# Patient Record
Sex: Male | Born: 1945 | Race: White | Hispanic: No
Health system: Southern US, Community
[De-identification: ages and names within clinical notes are randomized; demographics above are authoritative.]

---

## 2011-12-14 ENCOUNTER — Inpatient Hospital Stay: Payer: Self-pay | Admitting: Internal Medicine

## 2011-12-14 LAB — CK TOTAL AND CKMB (NOT AT ARMC)
CK, Total: 127 U/L (ref 35–232)
CK-MB: 2.4 ng/mL (ref 0.5–3.6)

## 2011-12-14 LAB — CBC
HGB: 9.5 g/dL — ABNORMAL LOW (ref 13.0–18.0)
MCH: 34.4 pg — ABNORMAL HIGH (ref 26.0–34.0)
MCHC: 33.1 g/dL (ref 32.0–36.0)
MCV: 104 fL — ABNORMAL HIGH (ref 80–100)
RBC: 2.76 10*6/uL — ABNORMAL LOW (ref 4.40–5.90)
RDW: 14.3 % (ref 11.5–14.5)
WBC: 7.5 10*3/uL (ref 3.8–10.6)

## 2011-12-14 LAB — APTT: Activated PTT: 26.8 secs (ref 23.6–35.9)

## 2011-12-14 LAB — COMPREHENSIVE METABOLIC PANEL
Albumin: 3.2 g/dL — ABNORMAL LOW (ref 3.4–5.0)
Alkaline Phosphatase: 121 U/L (ref 50–136)
Creatinine: 1.44 mg/dL — ABNORMAL HIGH (ref 0.60–1.30)
EGFR (Non-African Amer.): 51 — ABNORMAL LOW
Glucose: 99 mg/dL (ref 65–99)
Osmolality: 282 (ref 275–301)
Potassium: 3.7 mmol/L (ref 3.5–5.1)
Sodium: 142 mmol/L (ref 136–145)

## 2011-12-14 LAB — TROPONIN I: Troponin-I: <0.02 ng/mL

## 2011-12-14 LAB — PROTIME-INR: INR: 0.9

## 2011-12-15 LAB — CK TOTAL AND CKMB (NOT AT ARMC)
CK, Total: 111 U/L (ref 35–232)
CK, Total: 103 U/L (ref 35–232)
CK-MB: 2 ng/mL (ref 0.5–3.6)

## 2011-12-15 LAB — CBC WITH DIFFERENTIAL/PLATELET
Basophil #: 0.1 10*3/uL (ref 0.0–0.1)
Basophil %: 0.9 %
Eosinophil #: 0.3 10*3/uL (ref 0.0–0.7)
Eosinophil %: 4.4 %
HCT: 26.1 % — ABNORMAL LOW (ref 40.0–52.0)
HGB: 8.8 g/dL — ABNORMAL LOW (ref 13.0–18.0)
Lymphocyte #: 2.5 10*3/uL (ref 1.0–3.6)
Lymphocyte %: 35.1 %
MCV: 104 fL — ABNORMAL HIGH (ref 80–100)
Monocyte #: 0.7 x10 3/mm (ref 0.2–1.0)
Neutrophil #: 3.5 10*3/uL (ref 1.4–6.5)
Platelet: 426 10*3/uL (ref 150–440)
RBC: 2.51 10*6/uL — ABNORMAL LOW (ref 4.40–5.90)
RDW: 14.5 % (ref 11.5–14.5)
WBC: 7 10*3/uL (ref 3.8–10.6)

## 2011-12-15 LAB — BASIC METABOLIC PANEL
Anion Gap: 9 (ref 7–16)
BUN: 10 mg/dL (ref 7–18)
Calcium, Total: 8.6 mg/dL (ref 8.5–10.1)
Co2: 26 mmol/L (ref 21–32)
EGFR (African American): >60
EGFR (Non-African Amer.): 54 — ABNORMAL LOW
Sodium: 144 mmol/L (ref 136–145)

## 2011-12-15 LAB — LIPID PANEL
Cholesterol: 114 mg/dL (ref 0–200)
Ldl Cholesterol, Calc: 42 mg/dL (ref 0–100)
VLDL Cholesterol, Calc: 15 mg/dL (ref 5–40)

## 2011-12-15 LAB — TROPONIN I: Troponin-I: <0.02 ng/mL

## 2012-11-07 ENCOUNTER — Ambulatory Visit: Payer: Self-pay | Admitting: Family Medicine

## 2012-11-07 LAB — URINALYSIS, COMPLETE
Bacteria: NEGATIVE
Nitrite: NEGATIVE
Squamous Epithelial: NONE SEEN

## 2012-11-22 ENCOUNTER — Ambulatory Visit: Payer: Self-pay | Admitting: Urology

## 2014-11-09 NOTE — Consult Note (Signed)
PATIENT NAME:  Michael Luna, Echo MR#:  [id number] DATE OF BIRTH:  01/19/1946  DATE OF CONSULTATION:  12/14/2011  REFERRING PHYSICIAN:   CONSULTING PHYSICIAN:  Laurier NancyShaukat A. Zyaire Dumas, MD  INDICATION FOR CONSULTATION: Chest pain.   HISTORY OF PRESENT ILLNESS: This is a 69 year old white male with a past medical history of hypertension and hypercholesterolemia who came in with chest pain. The chest pain was pressure-type associated with shortness of breath, diaphoresis, and nausea. He says two days ago he started having pressure-type chest pain, 5/10, associated with nausea and diaphoresis. He sat down and got better. Last night he had another episode of chest pain and then this morning he was walking and had another episode of chest pain, thus he came into the Emergency Room. In the Emergency Room he had 8/10 chest pain. He received some morphine and nitroglycerin with relief of the chest pain. He is not having chest pain right now but is having some tightness.   PAST MEDICAL HISTORY:  1. History of bipolar disorder. 2. Hypertension. 3. Hypercholesterolemia. 4. History of hypothyroidism.   SOCIAL HISTORY: Quit smoking seven years ago. Occasional drinking, social drinking history.   REVIEW OF SYSTEMS: He has history of TIA in the past.   PHYSICAL EXAMINATION:   GENERAL: He is alert and oriented x3.   VITAL SIGNS: Blood pressure 130/70, pulse 70, respirations 14. He is afebrile.   NECK: No JVD.   LUNGS: Clear.   HEART: Regular rate and rhythm. Normal S1, S2. No audible murmur.   ABDOMEN: Soft, nontender. Positive bowel sounds.   EXTREMITIES: No pedal edema.   NEUROLOGIC: The patient appears to be intact.   EKG shows normal sinus rhythm, 97 beats per minute, left axis deviation, nonspecific ST-T changes.   ASSESSMENT AND PLAN: The patient has unstable angina with multiple risk factors for coronary artery disease. He is still having some tightness in the chest. There are no acute EKG changes,  however, given his presentation plan is to do left heart catheterization to further evaluate. The patient has agreed to do the procedure.  ____________________________ Laurier NancyShaukat A. Leonardo Makris, MD sak:drc D: 12/14/2011 20:06:10 ET T: 12/15/2011 05:48:46 ET JOB#: [id number]  cc: Laurier NancyShaukat A. Rosabel Sermeno, MD, <Dictator> Laurier NancySHAUKAT A Efraim Vanallen MD ELECTRONICALLY SIGNED 12/26/2011 15:46

## 2014-11-09 NOTE — Discharge Summary (Signed)
PATIENT NAME:  Michael Luna, Michael Luna MR#:  [id number] DATE OF BIRTH:  February 19, 1946  DATE OF ADMISSION:  12/14/2011 DATE OF DISCHARGE:  12/15/2011  DISCHARGE DIAGNOSES:  1. Chest pain, not cardiac in nature, possibly related to GI/reflux.  2. Hypertension.  3. Hypothyroidism.  4. Anemia of chronic disease.  5. History of bipolar disorder.   CONSULT: Dr. Welton FlakesKhan    PROCEDURE: The patient underwent a cardiac catheterization on 12/15/2011 which showed normal ejection fraction and normal coronary arteries.   LABORATORY, DIAGNOSTIC, AND RADIOLOGICAL DATA: Troponin x3 were negative. Discharge white blood cells 7, hemoglobin 8.8, hematocrit 26.1, platelets 426, sodium 144, potassium 3.9, chloride 109, bicarb 26, BUN 10, creatinine 1.38, glucose 116, cholesterol 114, triglycerides 77, HDL 57, VLDL 15, LDL 42.   CT of the chest for PE was negative for pulmonary emboli.   HOSPITAL COURSE: The patient is a 69 year old male who presented with chest pain. For further details, please refer to the history and physical.  1. Chest pain. The patient was admitted to telemetry. Cardiac enzymes were cycled, all of which were negative. There were no acute events on telemetry. He underwent cardiac catheterization by Dr. Adrian BlackwaterShaukat Khan which essentially was normal. Chest pain could be related to GI issues with reflux or hiatal hernia. It was recommended that he continue on a PPI.  2. Bipolar disease. The patient will continue current medication. 3. Hypertension, well controlled on his outpatient medication regimen.  4. Hypothyroidism. He is on Synthroid.  5. Anemia of chronic disease. His hemoglobin remained stable.    DISCHARGE MEDICATIONS:  1. Lasix 1 tablet daily.  2. Ciprofloxacin 500 b.i.d. which was given to him for urinary tract infection.  3. Docusate 100 mg b.i.d.  4. Oxycodone 5 mg 1 to 2 tablets q.4 to 6 hours p.r.n. pain.  5. Tylenol 500 mg 2 tablets p.r.n.  6. Calcium 2 tablets daily. 7. Fish Oil 1000 mg  t.i.d.  8. Cyclobenzaprine 10 mg b.i.d. p.r.n.  9. Methylphenidate 10 mg 3 tablets b.i.d.  10. Topiramate 50 mg 2 tablets daily.  11. Synthroid 50 mcg daily.  12. Atorvastatin 40 mg daily.  13. Omeprazole 40 mg b.i.d.  14. Finasteride 5 mg daily.  15. Bupropion 150 mg 3 tablets daily.  16. Quetiapine 200 mg half a tablet in the morning, 3 tablets in the evening.  17. Norvasc 5 mg daily.  18. Tizanidine 2 mg q.6 hours p.r.n. spasm.  19. Tamsulosin 0.4 mg daily.   DISCHARGE DIET: Low sodium diet.   DISCHARGE ACTIVITY: As tolerated. No exertion or heavy lifting for one week.   TIME SPENT: 30 minutes.   ____________________________ Janyth ContesSital P. Juliene PinaMody, MD spm:drc D: 12/15/2011 13:30:13 ET T: 12/16/2011 09:33:34 ET JOB#: [id number]  cc: Liem Copenhaver P. Juliene PinaMody, MD, <Dictator>  Janyth ContesSITAL P Iszabella Hebenstreit MD ELECTRONICALLY SIGNED 12/23/2011 12:40

## 2014-11-09 NOTE — H&P (Signed)
PATIENT NAME:  Michael Luna, Michael Luna MR#:  [id number] DATE OF BIRTH:  Nov 19, 1945  DATE OF ADMISSION:  12/14/2011  PRIMARY CARE PHYSICIAN: Dr. Delton PrairiePaul Tobin, [place], [place].   CHIEF COMPLAINT: Chest pressure.  HISTORY OF PRESENT ILLNESS: The patient is a 69 year old male who has history of transient ischemic attack. He has history of bipolar disorder, anxiety, gastroesophageal reflux disease, hiatal hernia, Barrett's esophagus, hypertension, and hypothyroidism. Today she is sent from the primary care physician's office because of chest pressure. He says his chest pressure started last night. Last night he went for a small walk and he started having significant chest pressure on the front of the chest. He could not take a deep breath with the chest pressure, then he rested and the chest pressure resolved. He could sleep overnight, but then he started having chest pressure again this morning when he went for a walk. He went to the driveway and to the mailbox and then he started having this chest pressure again associated with shortness of breath, but he denies any diaphoresis or nausea. He rested and then the chest pressure eased off. Again later in the afternoon, he started having chest pressure. He also started having upper back pain with some shortness of breath. He went to see his primary care physician's office. He was given one dose of sublingual nitroglycerin that did not help the chest pain and he was transferred to the Emergency Room. He was given four pills of 81 mg of aspirin at Dr. Verdene Lennertobin's office. His chest pressure has improved right now. The upper back pain has improved. He says he has been told that he had angina in the past. He used to see a cardiologist in [place]. He had a stress test about a month ago prior to the back surgery for preoperative evaluation and that was negative. He never had a cardiac catheterization in the past so he is being admitted for unstable angina. He got a dose of Lovenox in the  Emergency Room. Cardiology has been consulted by ER physician and plan for catheterization in the morning. He says that he has hiatal hernia and he has heartburn, but this chest pressure is different from heartburn. In the Emergency Room says he had some anxiety spells when they pushed 2 milligrams of IV morphine.   REVIEW OF SYSTEMS: He denies any fever or weakness. No acute change in vision. No headache. No dizziness. He says he has a dry cough since back surgery. No painful respiration. He presented with chest pressure. No palpitations or syncope. No nausea, vomiting, diarrhea, abdominal pain, gastrointestinal bleed. He said that he had some prostate infection recently, some dysuria. He is on antibiotic for that. He also has some difficulty urinating. He has thyroid problems. No anemia. No rash. No joint pains. His back pain is better after the back surgery. No focal numbness or weakness. He has anxiety.   PAST MEDICAL HISTORY:  1. Hypertension.  2. Hyperlipidemia.  3. Hypothyroidism. 4. Bipolar disorder. 5. Hiatal hernia with history of Barrett's esophagus and gastroesophageal reflux disease. 6. History of transient ischemic attack. 7. He had recent back surgery about three weeks ago, laminectomy and lumbar fusion and a [facility].   ALLERGIES TO MEDICATIONS: He is allergic to Romazicon, Lithium, Darvon, Vibramycin, erythromycin and tetracycline.   HOME MEDICATIONS: (This is what the patient told me). 1. Seroquel 100 mg in the morning, 600 mg at bedtime. 2. Topamax  100 mg daily.  3. Wellbutrin 450 mg daily.  4. Ritalin 30 mg in the morning and 30 mg at noon. 5. Prilosec 20 mg b.i.d.  6. Synthroid 50 mcg daily.  7. Lipitor 40 mg at bedtime.  8. Amlodipine 5 mg daily.  9. Ciprofloxacin 250 mg b.i.d. for two weeks, recently started on furosemide.  10. Finasteride 5 mg daily. 11. Cyclobenzaprine 10 mg twice a day  p.r.n.  12. Zanaflex 2 mg every six hours p.r.n. for spasm.  13. He is not taking Flomax anymore.  14. Acetaminophen p.r.n.  15. Fish oil 1000 mg t.i.d.  16. Calcium with vitamin D, two tablets in the morning, one at noon and two at bedtime.   SOCIAL HISTORY: He recently moved to [facility]. He quit smoking seven years ago. He smoked for about 40 years, a pack per day. Occasional alcohol drinker. He denies any drug use.   FAMILY HISTORY: Significant for stroke. His dad had stroke. Grandmother had stroke. Mother had stroke, but no history of early coronary artery disease in the family.   PHYSICAL EXAMINATION:  VITAL SIGNS: In the Emergency Room, temperature 99.2, heart rate 103, respiratory 18, blood pressure 117/66, saturating 95% on room air.   GENERAL: He is an elderly Caucasian male, well built. His chest pain has improved right now, not in acute distress.   HEENT: Bilateral pupils are equal. Extraocular muscles intact. No scleral icterus. No conjunctivitis. Oral mucosa is moist. No pallor.   NECK: No thyroid tenderness, enlargement, or nodules. Neck is supple. No masses, nontender. No adenopathy. No JVD. No carotid bruit.   CHEST: Bilateral breath sounds are clear. No wheeze. Normal effort. No respiratory distress.   HEART: Heart sounds are regular. No murmur. Good peripheral pulses. No lower extremity edema.   ABDOMEN: Soft, nontender. Normal bowel sounds. No hepatomegaly. No bruit. No masses.   RECTAL: Deferred.   NEUROLOGIC: He is awake, alert, oriented to time, place, and person. Cranial nerves are intact. Moving all extremities against gravity.   EXTREMITIES: No cyanosis. No clubbing.   SKIN: He has a scar in the lower back area which appears to be healing well.   LABORATORY, RADIOLOGICAL AND DIAGNOSTIC DATA: White count 7.5, hemoglobin 9.5, platelet count 481,000. BMP: Sodium 142, potassium 3.7, BUN 10, creatinine 1.44. CK 127. Troponin is negative. He had a CT of the chest  for PE with contrast but did not show any evidence of PE. The lungs are clear. His EKG shows sinus rhythm but no acute ischemia. He has left axis deviation.   IMPRESSION:  1. Chest pain with typical features.  2. Unstable angina. Rule out myocardial infarction. Rule out coronary artery disease.  3. Hypertension. 4. Hyperlipidemia. 5. Hypothyroidism. 6. Bipolar disorder. 7. Anxiety.  8. He has anemia, most likely postoperative with recent back surgery.  9. Renal insufficiency. 10. History of transient ischemic attack. 11. Gastroesophageal reflux disease.   PLAN: A 69 year old male who was a previous smoker with history of hypertension, hyperlipidemia, hypothyroidism, history of transient ischemic attack, back surgery about three weeks ago. He presented with typical chest pains with exertion, improved at rest, did not improve with sublingual nitroglycerin. He had a stress test at [facility] a month ago that was negative. He already received four baby aspirins in Dr. Verdene Lennert office. I am going to start him on metoprolol. He is already on Lipitor 40 mg at bedtime. I am going to continue. He started on Lovenox, will continue. Will start nitroglycerin on him.  He says he has anxiety spells with morphine which I am not going to order. We will check a lipid profile on him. Cardiology has already been consulted. Plan is for a cardiac catheterization in the morning. I will give him some IV hydration with half normal saline because of his renal insufficiency. We will restart all his psychotropic medications. Plan was discussed with the patient and the family members, also discussed with Dr. Adrian Blackwater.   TIME SPENT WITH ADMISSION AND COORDINATION OF CARE: 55 minutes.    ____________________________ Fredia Sorrow, MD ag:ap D: 12/14/2011 20:06:11 ET T: 12/15/2011 07:47:53 ET JOB#: [id number]  cc: Fredia Sorrow, MD, <Dictator> Vic Ripper. Mariana Kaufman, MD Fredia Sorrow MD ELECTRONICALLY SIGNED 01/02/2012 11:44

## 2014-11-09 NOTE — Consult Note (Signed)
Patient has normal coronarie with normal LVEF. Advise discharging with protonix 40 bid with f/u office next monday 2 pm.  Electronic Signatures: Radene KneeKhan, Brnadon Eoff Ali (MD)  (Signed on 30-May-13 09:55)  Authored  Last Updated: 30-May-13 09:55 by Radene KneeKhan, Jaunice Mirza Ali (MD)

## 2014-12-10 IMAGING — CT CT ABD-PELV W/O CM
1 of 3 series · 14 of 32 positions shown, 19 images · non-contrast
Comparison: None

REASON FOR EXAM: CALL REPORT [id number] RLQ PAIN Right Flank Pain
Hematuria Eval for stone
COMMENTS:

PROCEDURE:     CT  - CT ABDOMEN AND PELVIS W[DATE]  [DATE]
RESULT:      Indication: Flank Pain
TECHNIQUE: Multiple axial images from the lung bases to the symphysis pubis
were obtained without oral and without intravenous contrast.

[Series 3: [place] · axial · 0.72mm/px · z∈[-542,-117]mm · 14 of 394 slices shown, 19 images]
[im 20/394  soft-tissue]
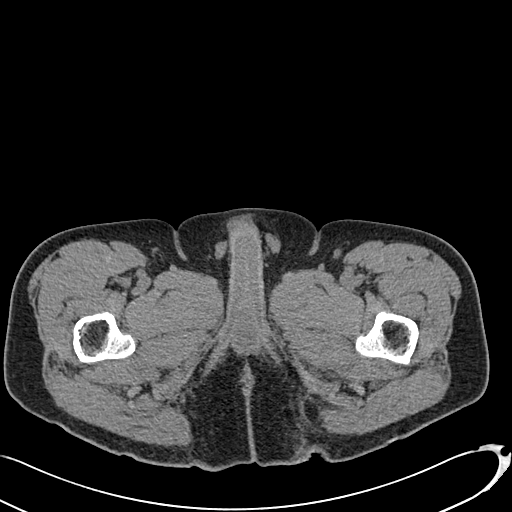
[im 20/394  bone]
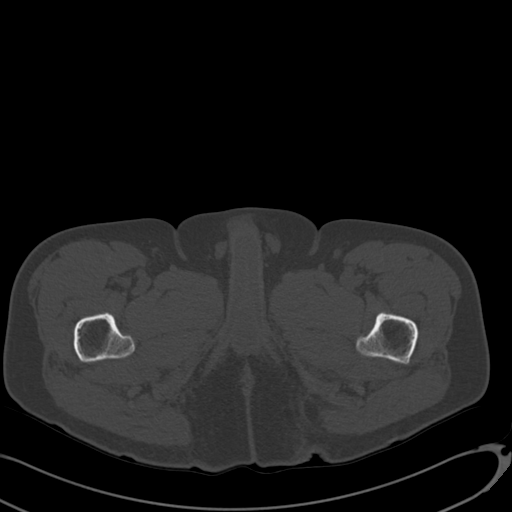
[im 59/394  soft-tissue]
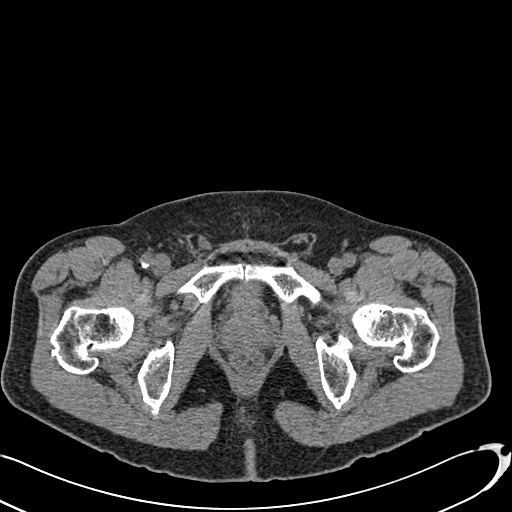
[im 79/394  soft-tissue]
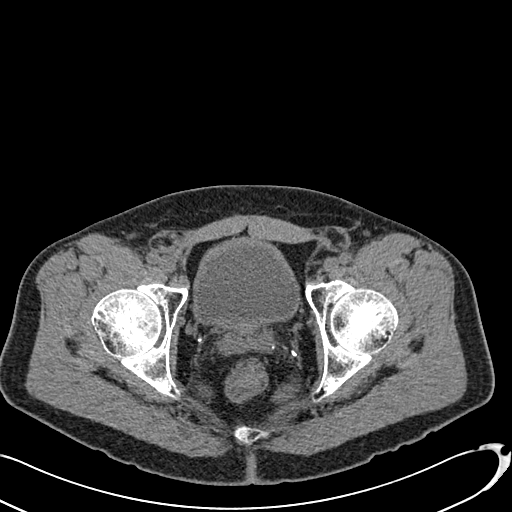
[im 118/394  soft-tissue]
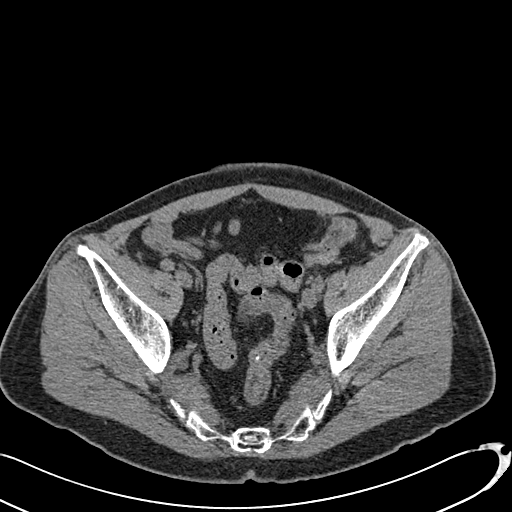
[im 138/394  soft-tissue]
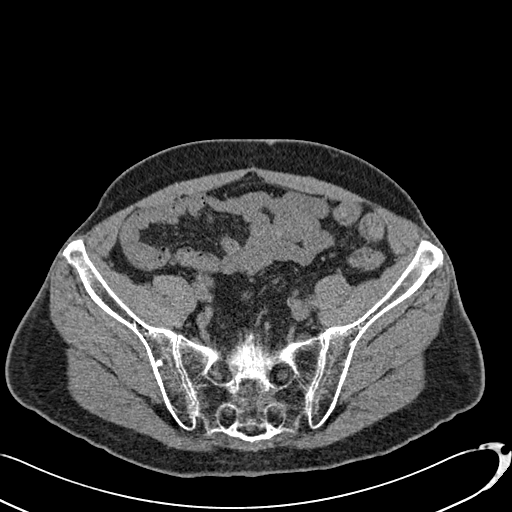
[im 177/394  soft-tissue]
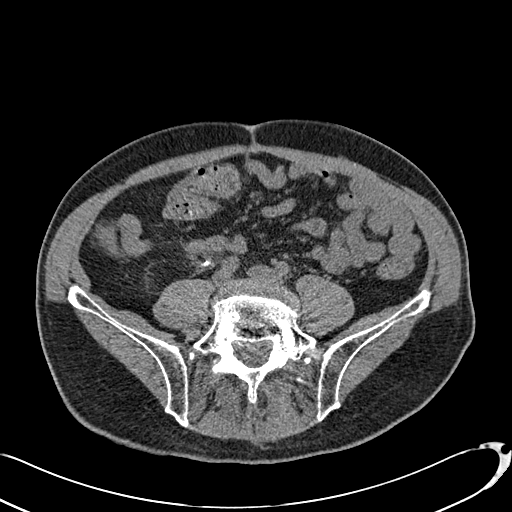
[im 197/394  soft-tissue]
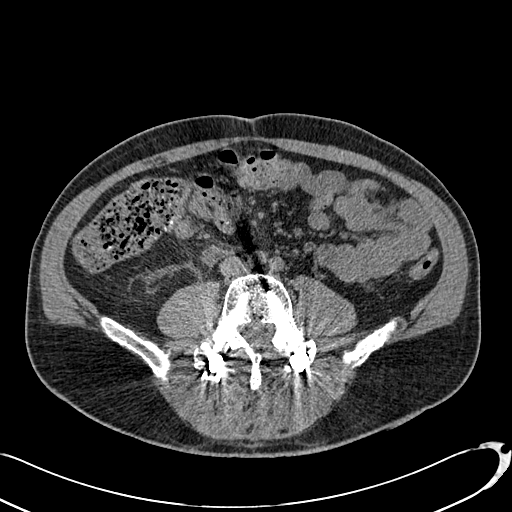
[im 217/394  soft-tissue]
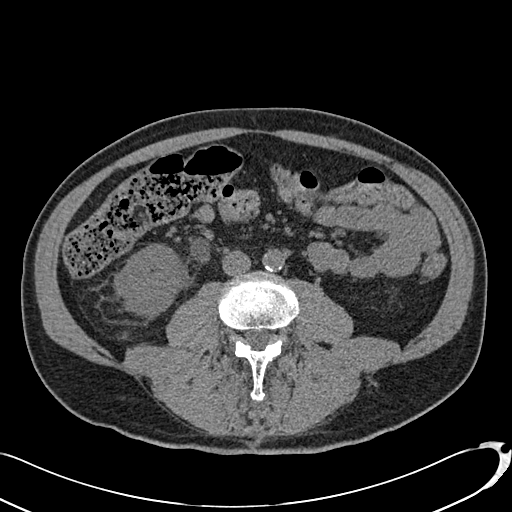
[im 256/394  soft-tissue]
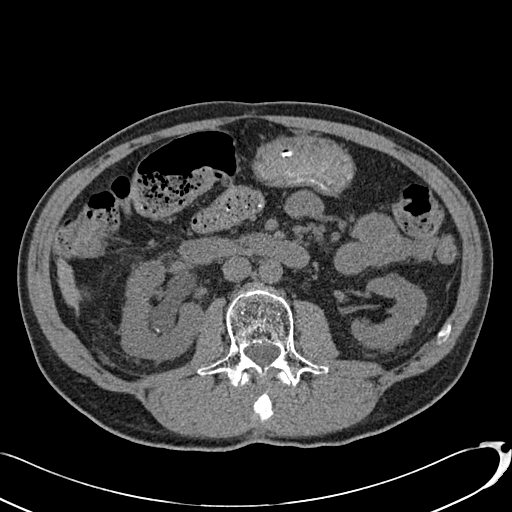
[im 256/394  bone]
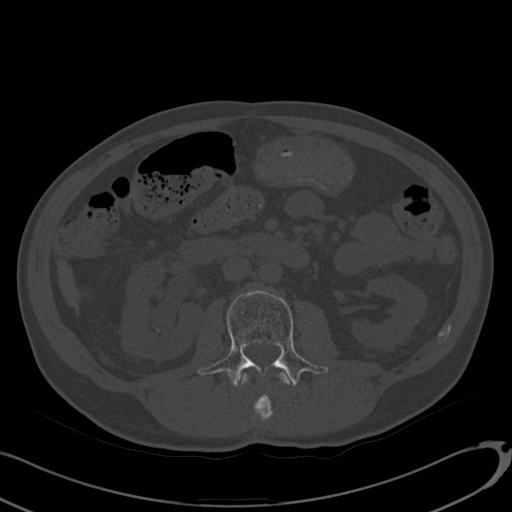
[im 276/394  soft-tissue]
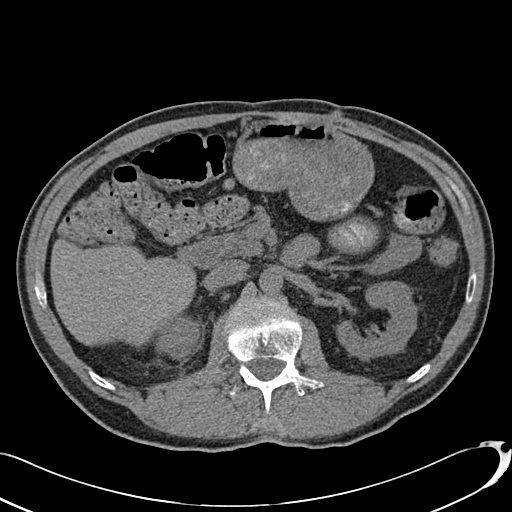
[im 315/394  soft-tissue]
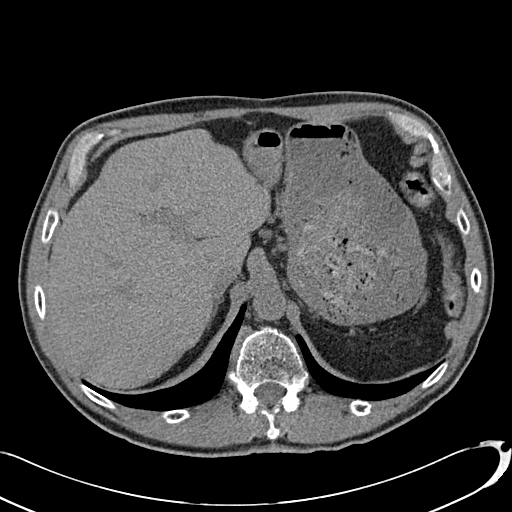
[im 315/394  lung]
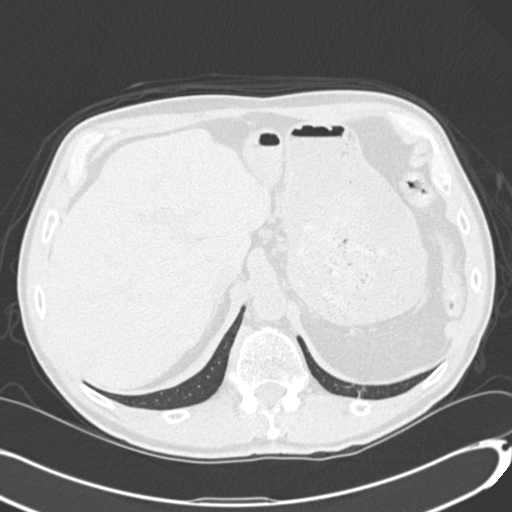
[im 335/394  soft-tissue]
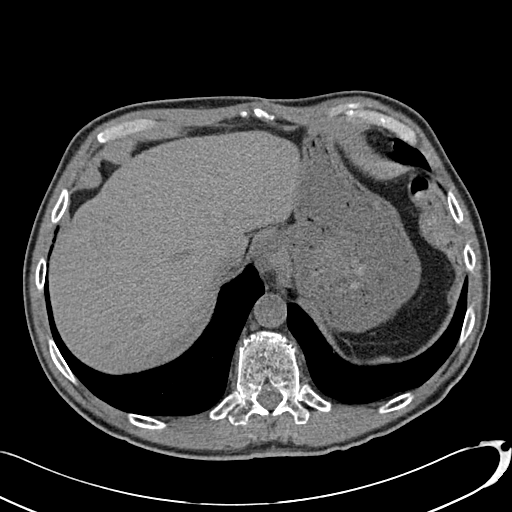
[im 335/394  lung]
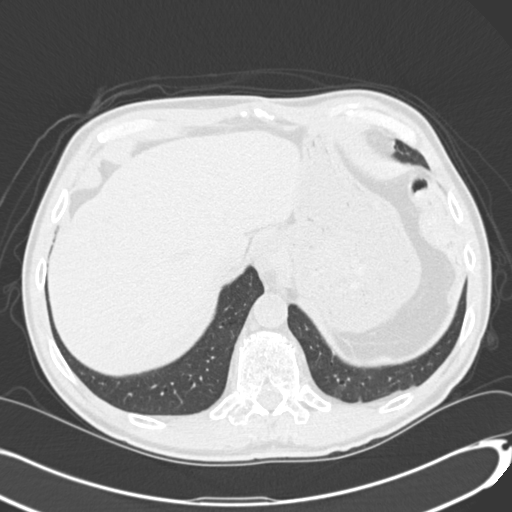
[im 354/394  lung]
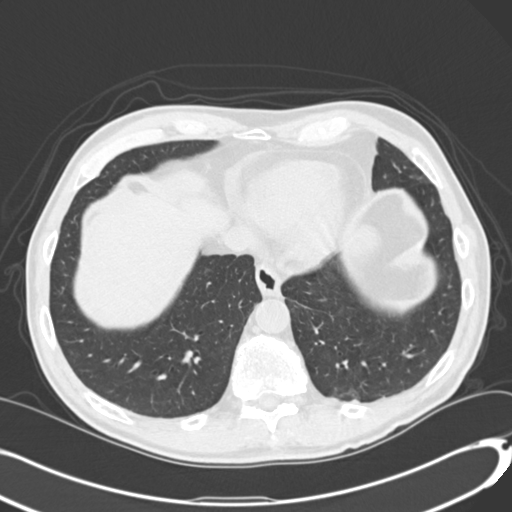
[im 374/394  soft-tissue]
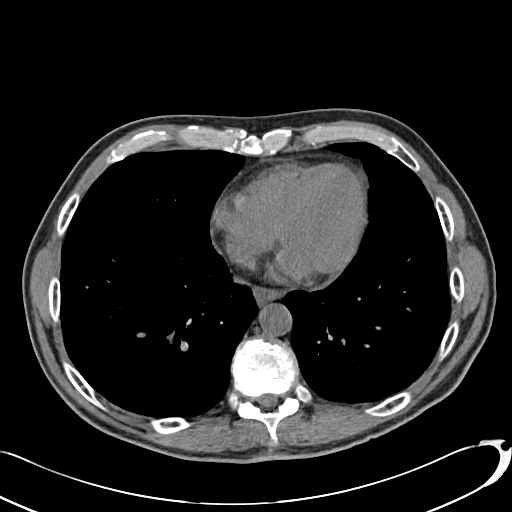
[im 374/394  lung]
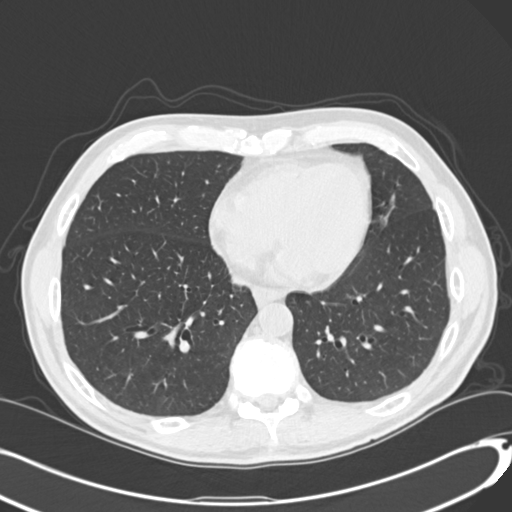

[14 of 32 positions shown; findings below may reference images not displayed]

FINDINGS: The lung bases are clear. There is no pleural or pericardial effusions.

There is a 6mm mid right ureteral calculus resulting in moderate
hydroureteronephrosis. There are bilateral nephrolithiasis. There is
calcification along the right bladder base which may represent calcified
plaque. No perinephric stranding is seen. The kidneys are symmetric in size
without evidence for exophytic mass. The bladder is unremarkable.

The liver demonstrates no focal abnormality. The gallbladder is surgically
absent. The spleen is surgically absent. The adrenal glands and pancreas are
normal.

The unopacified stomach, duodenum, small intestine, and large intestine are
unremarkable, but evaluation is limited by lack of oral contrast.  There is
no pneumoperitoneum, pneumatosis, or portal venous gas. There is no
abdominal or pelvic free fluid. There is no lymphadenopathy.

The abdominal aorta is normal in caliber.

There is posterior spinal fusion at L4-L5.
IMPRESSION: 1. There is a 6mm mid right ureteral calculus resulting in moderate
hydroureteronephrosis. There are bilateral nephrolithiasis. There is
calcification along the right bladder base which may represent calcified
plaque which may be secondary to postinflammatory or infectious versus
neoplastic etiology.

[REDACTED]

## 2014-12-25 IMAGING — CR DG ABDOMEN 1V
1 series · 2 of 2 positions shown · non-contrast
Comparison: none

REASON FOR EXAM: Calculus
COMMENTS:

[Series 1: supine kub · 0.17mm/px · 2 of 2 slices shown]
[im 1/2]
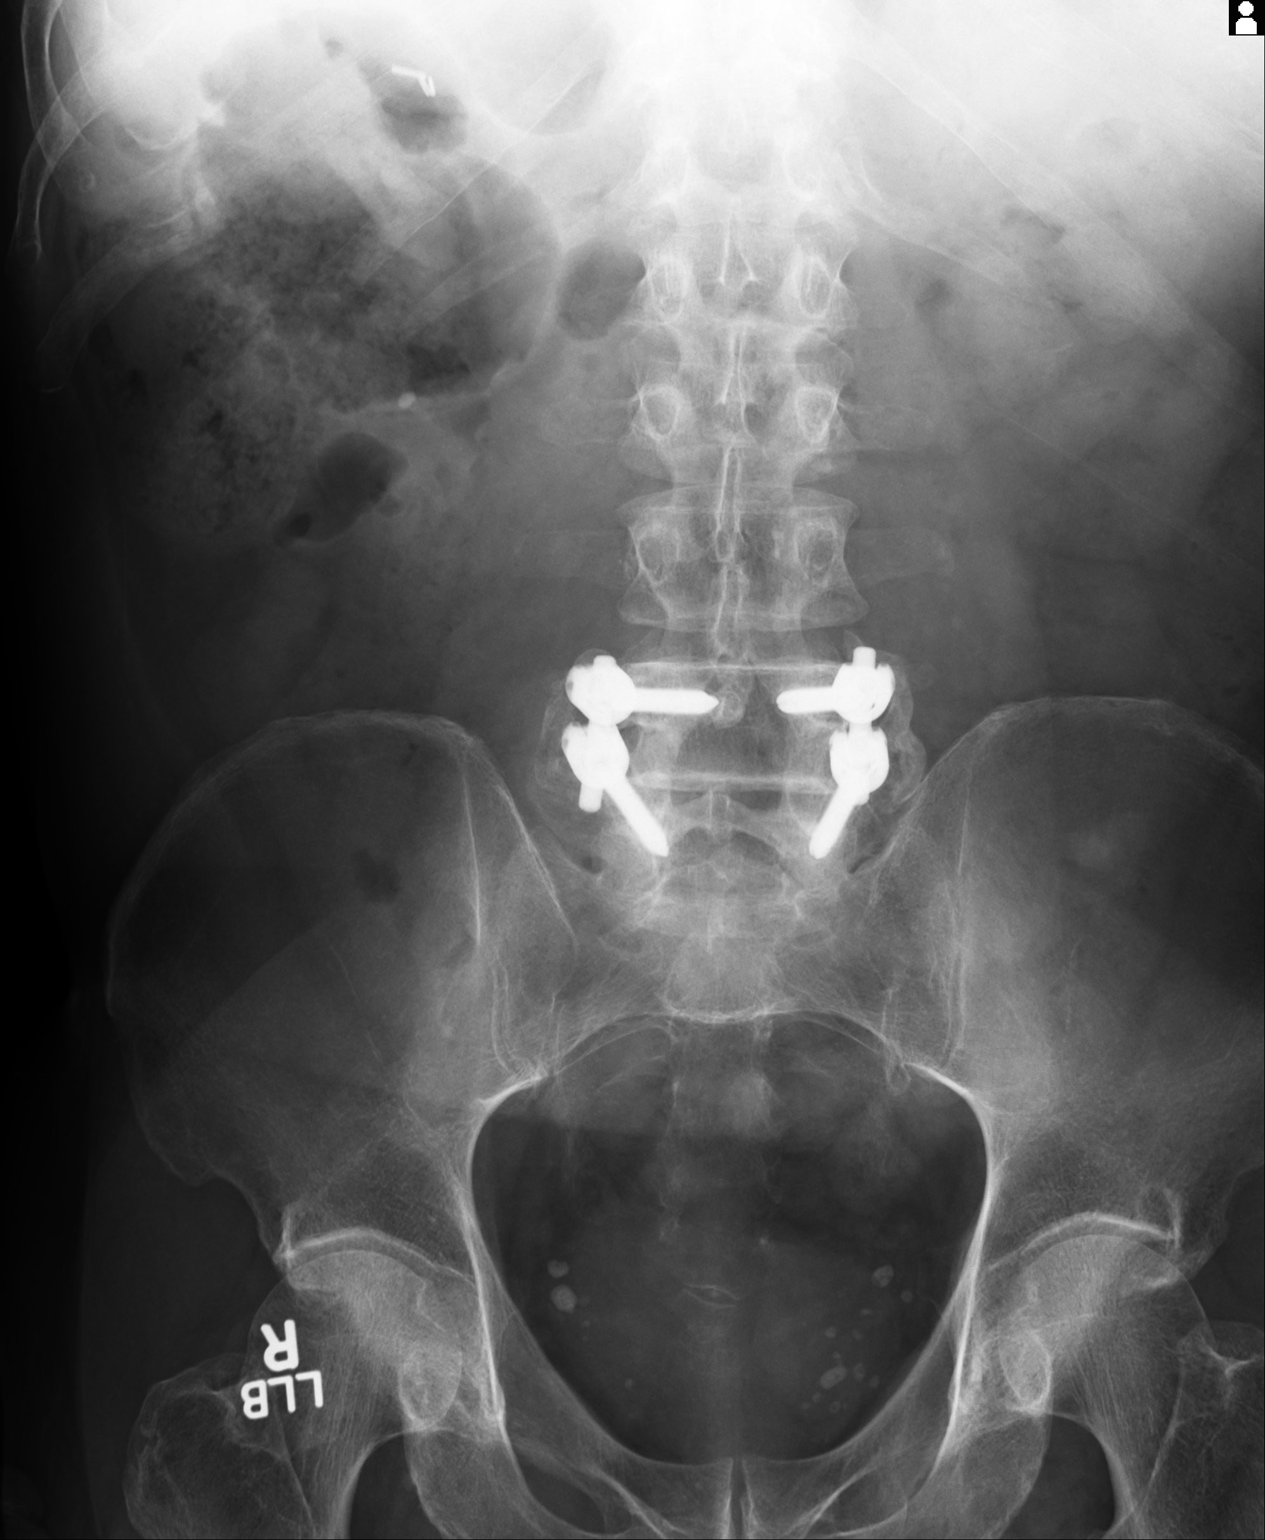
[im 2/2]
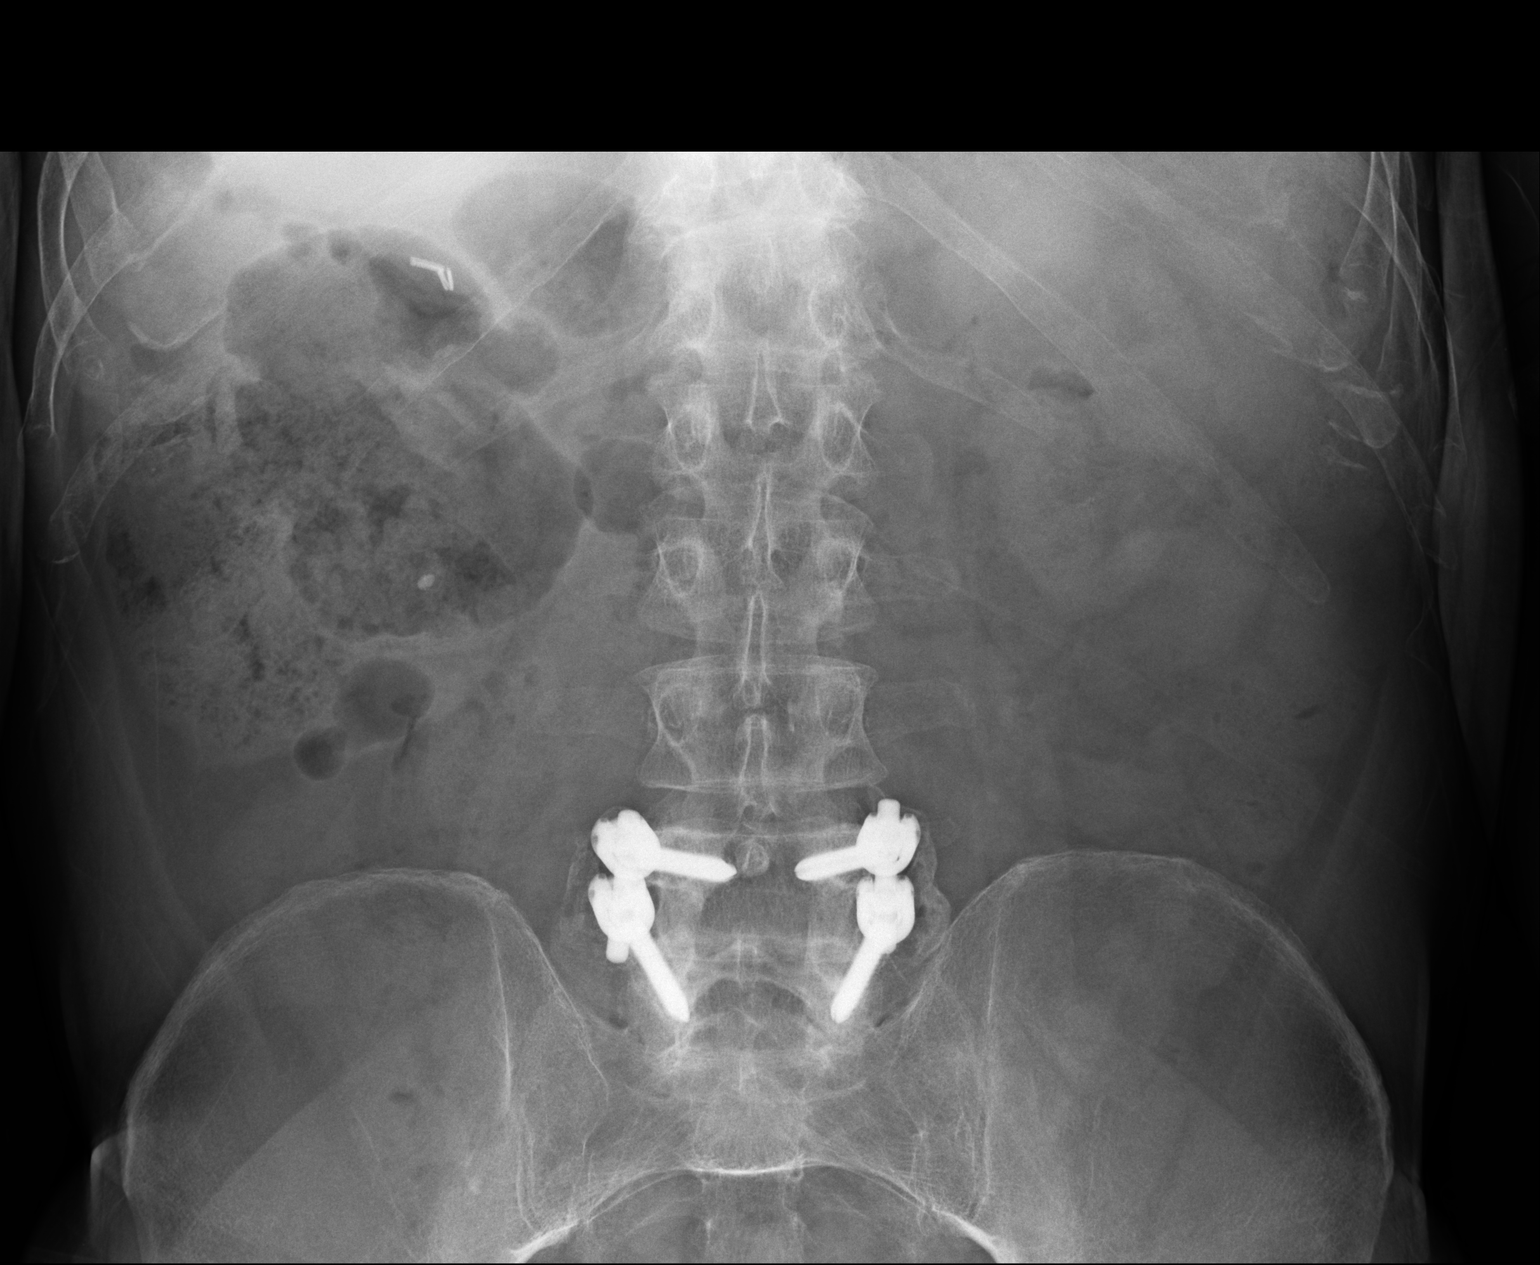

[2 of 2 positions shown; findings below may reference images not displayed]

PROCEDURE:     DXR - DXR KIDNEY URETER BLADDER  - November 22, 2012  [DATE]

RESULT:     There multiple calcifications in the pelvic regions
predominately suggestive of phleboliths. Distal ureteral calculus would be
difficult to exclude completely. The previously demonstrated right ureteral
calculus from the CT scan projecting just superior to the sacroiliac joint
on the right is not definitely identified. Bony fusion material is present
along with hardware at L4-L5. Right nephrolithiasis appears present.
Cholecystectomy clips are present. Minimal calcific density projects over
the left renal collecting system just inferior to the left twelfth rib tip.
The previous CT showed some small stones in the lower pole of the left
kidney.
IMPRESSION: 1. The previously demonstrated right ureteral calculus is not definitely
identified on today's KUB. Numerous phleboliths are present in the pelvic
region. Two large rounded calcific densities on the right were seen
previously on the CT and likely do not represent a distal ureteral calculus.
2. Right nephrolithiasis. Small left renal calculi are suggested over the
lower pole collecting system as seen on the previous CT.

[REDACTED]
